# Patient Record
Sex: Female | Born: 1990 | Race: White | Hispanic: No | Marital: Single | Smoking: Never smoker
Health system: Southern US, Community
[De-identification: ages and names within clinical notes are randomized; demographics above are authoritative.]

## PROBLEM LIST (undated history)

## (undated) DIAGNOSIS — N390 Urinary tract infection, site not specified: Secondary | ICD-10-CM

## (undated) HISTORY — DX: Urinary tract infection, site not specified: N39.0

---
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
## 2010-11-23 ENCOUNTER — Encounter: Payer: Self-pay | Admitting: Family Medicine

## 2011-01-18 LAB — URINALYSIS, ROUTINE W REFLEX MICROSCOPIC
Bilirubin Urine: NEGATIVE
Glucose, UA: NEGATIVE mg/dL
Ketones, ur: NEGATIVE mg/dL
Nitrite: NEGATIVE
Protein, ur: 30 mg/dL — AB
Specific Gravity, Urine: 1.026 (ref 1.005–1.030)
Urobilinogen, UA: 0.2 mg/dL (ref 0.0–1.0)
pH: 6 (ref 5.0–8.0)

## 2011-01-18 LAB — URINE MICROSCOPIC-ADD ON

## 2011-01-18 LAB — URINE CULTURE: Colony Count: 2000

## 2011-07-31 LAB — URINALYSIS, ROUTINE W REFLEX MICROSCOPIC
Bilirubin Urine: NEGATIVE
Nitrite: NEGATIVE
Specific Gravity, Urine: <1.005 — ABNORMAL LOW
pH: 6

## 2011-07-31 LAB — URINE MICROSCOPIC-ADD ON

## 2011-07-31 LAB — DIFFERENTIAL
Basophils Relative: 0
Eosinophils Absolute: 0
Eosinophils Relative: 0
Monocytes Absolute: 0.3
Monocytes Relative: 3
Neutrophils Relative %: 85 — ABNORMAL HIGH

## 2011-07-31 LAB — COMPREHENSIVE METABOLIC PANEL
ALT: 14
AST: 24
Albumin: 3.9
Alkaline Phosphatase: 47
Glucose, Bld: 101 — ABNORMAL HIGH
Potassium: 3.9
Sodium: 137
Total Protein: 6.7

## 2011-07-31 LAB — CBC
Hemoglobin: 11.7 — ABNORMAL LOW
RDW: 13.8

## 2011-07-31 LAB — GC/CHLAMYDIA PROBE AMP, GENITAL
Chlamydia, DNA Probe: NEGATIVE
GC Probe Amp, Genital: NEGATIVE

## 2013-02-10 ENCOUNTER — Other Ambulatory Visit: Payer: Self-pay | Admitting: Family Medicine

## 2013-02-16 ENCOUNTER — Telehealth: Payer: Self-pay | Admitting: Nurse Practitioner

## 2013-02-16 NOTE — Telephone Encounter (Signed)
Patient wont be able to make an appointment until May when she graduates from [employer] so she would like another refill of her birth control called into [facility] [place]

## 2013-02-16 NOTE — Telephone Encounter (Signed)
Advised patient we refilled her med via fax as well as electronically in last week and to have pharmacy contact us if further problems.

## 2013-03-20 ENCOUNTER — Encounter: Payer: Self-pay | Admitting: *Deleted

## 2013-03-23 ENCOUNTER — Ambulatory Visit (INDEPENDENT_AMBULATORY_CARE_PROVIDER_SITE_OTHER): Payer: BC Managed Care – PPO | Admitting: Nurse Practitioner

## 2013-03-23 ENCOUNTER — Encounter: Payer: Self-pay | Admitting: Nurse Practitioner

## 2013-03-23 VITALS — BP 104/68 | HR 80 | Ht 66.0 in | Wt 121.0 lb

## 2013-03-23 DIAGNOSIS — Z124 Encounter for screening for malignant neoplasm of cervix: Secondary | ICD-10-CM

## 2013-03-23 DIAGNOSIS — Z0000 Encounter for general adult medical examination without abnormal findings: Secondary | ICD-10-CM

## 2013-03-23 DIAGNOSIS — Z01419 Encounter for gynecological examination (general) (routine) without abnormal findings: Secondary | ICD-10-CM

## 2013-03-23 DIAGNOSIS — Z113 Encounter for screening for infections with a predominantly sexual mode of transmission: Secondary | ICD-10-CM

## 2013-03-23 MED ORDER — NORGESTIM-ETH ESTRAD TRIPHASIC 0.18/0.215/0.25 MG-35 MCG PO TABS: ORAL_TABLET | ORAL | Status: DC

## 2013-03-23 NOTE — Patient Instructions (Signed)
HPV Vaccine Questions and Answers WHAT IS HUMAN PAPILLOMAVIRUS (HPV)? HPV is a virus that can lead to cervical cancer; vulvar and vaginal cancers; penile cancer; anal cancer and genital warts (warts in the genital areas). More than 1 vaccine is available to help you or your child with protection against HPV. Your caregiver can talk to you about which one might give you the best protection. WHO SHOULD GET THIS VACCINE? The HPV vaccine is most effective when given before the onset of sexual activity.  This vaccine is recommended for girls 11 or 22 years of age. It can be given to girls as young as 22 years old.  HPV vaccine can be given to males, 9 through 22 years of age, to reduce the likelihood of acquiring genital warts.  HPV vaccine can be given to males and females aged 9 through 26 years to prevent anal cancer. HPV vaccine is not generally recommended after age 26, because most individuals have been exposed to the HPV virus by that age. HOW EFFECTIVE IS THIS VACCINE?  The vaccine is generally effective in preventing cervical; vulvar and vaginal cancers; penile cancer; anal cancer and genital warts caused by 4 types of HPV. The vaccine is less effective in those individuals who are already infected with HPV. This vaccine does not treat existing HPV, genital warts, pre-cancers or cancers. WILL SEXUALLY ACTIVE INDIVIDUALS BENEFIT FROM THE VACCINE? Sexually active individuals may still benefit from the vaccine but may get less benefit due to previous HPV exposure. HOW AND WHEN IS THE VACCINE ADMINISTERED? The vaccine is given in a series of 3 injections (shots) over a 6 month period in both males and females. The exact timing depends on which specific vaccine your caregiver recommends for you. IS THE HPV VACCINE SAFE?  The federal government has approved the HPV vaccine as safe and effective. This vaccine was tested in both males and females in many countries around the world. The most common  side effect is soreness at the injection site. Since the drug became approved, there has been some concern about patients passing out after being vaccinated, which has led to a recommendation of a 15 minute waiting period following vaccination. This practice may decrease the small risk of passing out. Additionally there is a rare risk of anaphylaxis (an allergic reaction) to the vaccine and a risk of a blood clot among individuals with specific risk factors for a blood clot. DOES THIS VACCINE CONTAIN THIMEROSAL OR MERCURY? No. There is no thimerosal or mercury in the HPV vaccine. It is made of proteins from the outer coat of the virus (HPV). There is no infectious material in this vaccine. WILL GIRLS/WOMEN WHO HAVE BEEN VACCINATED STILL NEED CERVICAL CANCER SCREENING? Yes. There are 3 reasons why women will still need regular cervical cancer screening. First, the vaccine will NOT provide protection against all types of HPV that cause cervical cancer. Vaccinated women will still be at risk for some cancers. Second, some women may not get all required doses of the vaccine (or they may not get them at the recommended times). Therefore, they may not get the vaccine's full benefits. Third, women may not get the full benefit of the vaccine if they receive it after they have already acquired any of the 4 types of HPV. WILL THE HPV VACCINE BE COVERED BY INSURANCE PLANS? While some insurance companies may cover the vaccine, others may not. Most large group insurance plans cover the costs of recommended vaccines. WHAT KIND OF GOVERNMENT PROGRAMS   MAY BE AVAILABLE TO COVER HPV VACCINE? Federal health programs such as Vaccines for Children (VFC) will cover the HPV vaccine. The VFC program provides free vaccines to children and adolescents under 19 years of age, who are either uninsured, Medicaid-eligible, [place] Indian or [place] Native. There are over 45,000 sites that provide VFC vaccines including hospital, private  and public clinics. The VFC program also allows children and adolescents to get VFC vaccines through Federally Qualified Health Centers or Rural Health Centers if their private health insurance does not cover the vaccine. Some states also provide free or low-cost vaccines, at public health clinics, to people without health insurance coverage for vaccines. GENITAL HPV: WHY IS HPV IMPORTANT? Genital HPV is the most common virus transmitted through genital contact, most often during vaginal and anal sex. About 40 types of HPV can infect the genital areas of men and women. While most HPV types cause no symptoms and go away on their own, some types can cause cervical cancer in women. These types also cause other less common genital cancers, including cancers of the penis, anus, vagina (birth canal), and vulva (area around the opening of the vagina). Other types of HPV can cause genital warts in men and women. HOW COMMON IS HPV?   At least 50% of sexually active people will get HPV at some time in their lives. HPV is most common in young women and men who are in their late teens and early 20s.  Anyone who has ever had genital contact with another person can get HPV. Both men and women can get it and pass it on to their sex partners without realizing it. IS HPV THE SAME THING AS HIV OR HERPES? HPV is NOT the same as HIV or Herpes (Herpes simplex virus or HSV). While these are all viruses that can be sexually transmitted, HIV and HSV do not cause the same symptoms or health problems as HPV. CAN HPV AND ITS ASSOCIATED DISEASES BE TREATED? There is no treatment for HPV. There are treatments for the health problems that HPV can cause, such as genital warts, cervical cell changes, and cancers of the cervix (lower part of the womb), vulva, vagina and anus.  HOW IS HPV RELATED TO CERVICAL CANCER? Some types of HPV can infect a woman's cervix and cause the cells to change in an abnormal way. Most of the time, HPV goes  away on its own. When HPV is gone, the cervical cells go back to normal. Sometimes, HPV does not go away. Instead, it lingers (persists) and continues to change the cells on a woman's cervix. These cell changes can lead to cancer over time if they are not treated. ARE THERE OTHER WAYS TO PREVENT CERVICAL CANCER? Regular Pap tests and follow-up can prevent most, but not all, cases of cervical cancer. Pap tests can detect cell changes (or pre-cancers) in the cervix before they turn into cancer. Pap tests can also detect most, but not all, cervical cancers at an early, curable stage. Most women diagnosed with cervical cancer have either never had a Pap test, or not had a Pap test in the last 5 years. There is also an HPV DNA test available for use with the Pap test as part of cervical cancer screening. This test may be ordered for women over 30 or for women who get an unclear (borderline) Pap test result. While this test can tell if a woman has HPV on her cervix, it cannot tell which types of HPV she has.   If the HPV DNA test is negative for HPV DNA, then screening may be done every 3 years. If the HPV DNA test is positive for HPV DNA, then screening should be done every 6 to 12 months. OTHER QUESTIONS ABOUT THE HPV VACCINE WHAT HPV TYPES DOES THE VACCINE PROTECT AGAINST? The HPV vaccine protects against the HPV types that cause most (70%) cervical cancers (types 16 and 18), most (78%) anal cancers (types 16 and 18) and the two HPV types that cause most (90%) genital warts (types 6 and 11). WHAT DOES THE VACCINE NOT PROTECT AGAINST?  Because the vaccine does not protect against all types of HPV, it will not prevent all cases of cervical cancer, anal cancer, other genital cancers or genital warts. About 30% of cervical cancers are not prevented with vaccination, so it will be important for women to continue screening for cervical cancer (regular Pap tests). Also, the vaccine does not prevent about 10% of genital  warts nor will it prevent other sexually transmitted infections (STIs), including HIV. Therefore, it will still be important for sexually active adults to practice safe sex to reduce exposure to HPV and other STI's. HOW LONG DOES VACCINE PROTECTION LAST? WILL A BOOSTER SHOT BE NEEDED? So far, studies have followed women for 5 years and found that they are still protected. Currently, additional (booster) doses are not recommended. More research is being done to find out how long protection will last, and if a booster vaccine is needed years later.  WHY IS THE HPV VACCINE RECOMMENDED AT SUCH A YOUNG AGE? Ideally, males and females should get the vaccine before they are sexually active since this vaccine is most effective in individuals who have not yet acquired any of the HPV vaccine types. Individuals who have not been infected with any of the 4 types of HPV will get the full benefits of the vaccine.  SHOULD PREGNANT WOMEN BE VACCINATED? The vaccine is not recommended for pregnant women. There has been limited research looking at vaccine safety for pregnant women and their developing fetus. Studies suggest that the vaccine has not caused health problems during pregnancy, nor has it caused health problems for the infant. Pregnant women should complete their pregnancy before getting the vaccine. If a woman finds out she is pregnant after she has started getting the vaccine series, she should complete her pregnancy before finishing the 3 doses. SHOULD BREASTFEEDING MOTHERS BE VACCINATED? Mothers nursing their babies may get the vaccine because the virus is inactivated and will not harm the mother or baby. WILL INDIVIDUALS BE PROTECTED AGAINST HPV AND RELATED DISEASES, EVEN IF THEY DO NOT GET ALL 3 DOSES? It is not yet known how much protection individuals will get from receiving only 1 or 2 doses of the vaccine. For this reason, it is very important that individuals get all 3 doses of the vaccine. WILL  CHILDREN BE REQUIRED TO BE VACCINATED TO ENTER SCHOOL? There are no federal laws that require children or adolescents to get vaccinated. All school entry laws are state laws so they vary from state to state. To find out what vaccines are needed for children or adolescents to enter school in your state, check with your state health department or board of education. ARE THERE OTHER WAYS TO PREVENT HPV? The only sure way to prevent HPV is to abstain from all sexual activity. Sexually active adults can reduce their risk by being in a mutually monogamous relationship with someone who has had no other sex partners.   But even individuals with only 1 lifetime sex partner can get HPV, if their partner has had a previous partner with HPV. It is unknown how much protection condoms provide against HPV, since areas that are not covered by a condom can be exposed to the virus. However, condoms may reduce the risk of genital warts and cervical cancer. They can also reduce the risk of HIV and some other sexually transmitted infections (STIs), when used consistently and correctly (all the time and the right way). Document Released: 10/19/2005 Document Revised: 01/11/2012 Document Reviewed: 06/14/2009 [employer] Patient Information 2014 [employer].  

## 2013-03-24 ENCOUNTER — Encounter: Payer: Self-pay | Admitting: Nurse Practitioner

## 2013-03-24 LAB — PAP IG, CT-NG, RFX HPV ASCU

## 2013-03-24 NOTE — Progress Notes (Signed)
  Subjective:    Patient ID: Beverly Rose, female    DOB: 08/07/1991, 22 y.o.   MRN: [id number]  HPI presents for wellness checkup. Currently on birth control pills, denies any missed pills. Same sexual partner. No vaginal discharge pelvic pain or fever. Regular menstrual cycles, normal flow. Up-to-date on her eye exams and dental exams. Overall healthy diet. Regular exercise.    Review of Systems  Constitutional: Negative for activity change, appetite change and fatigue.  HENT: Negative for dental problem.   Eyes: Negative for visual disturbance.  Respiratory: Negative for chest tightness and shortness of breath.   Cardiovascular: Negative for chest pain.  Gastrointestinal: Negative for vomiting, diarrhea, constipation and abdominal distention.  Genitourinary: Negative for vaginal discharge, difficulty urinating, menstrual problem and pelvic pain.  Psychiatric/Behavioral: Negative for sleep disturbance.       Objective:   Physical Exam  Constitutional: She is oriented to person, place, and time. She appears well-developed. No distress.  HENT:  Right Ear: External ear normal.  Left Ear: External ear normal.  Mouth/Throat: Oropharynx is clear and moist.  Neck: Normal range of motion. Neck supple. No tracheal deviation present. No thyromegaly present.  Cardiovascular: Normal rate, regular rhythm and normal heart sounds.  Exam reveals no gallop.   No murmur heard. Pulmonary/Chest: Effort normal and breath sounds normal.  Abdominal: Soft. She exhibits no distension. There is no tenderness.  Genitourinary: Vagina normal and uterus normal. No vaginal discharge found.  Musculoskeletal: She exhibits no edema.  Lymphadenopathy:    She has no cervical adenopathy.  Neurological: She is alert and oriented to person, place, and time.  Skin: Skin is warm and dry. No rash noted.  Psychiatric: She has a normal mood and affect. Her behavior is normal.   Breast exam: Mild fine nodularity, no  dominant masses. Axilla no adenopathy. EGBUS normal limit. Vagina small amount of mucoid discharge. No CMT. Uterus and adnexa normal and nontender.         Assessment & Plan:  Well woman exam - Plan: Pap IG, CT/NG w/ reflex HPV when ASC-U  Screening for cervical cancer - Plan: Pap IG, CT/NG w/ reflex HPV when ASC-U  Routine screening for STI (sexually transmitted infection) - Plan: Pap IG, CT/NG w/ reflex HPV when ASC-U  Meds ordered this encounter  Medications  . Norgestimate-Ethinyl Estradiol Triphasic (TRI-PREVIFEM) 0.18/0.215/0.25 MG-35 MCG tablet    Sig: TAKE 1 TABLET BY MOUTH EVERY DAY AS DIRECTED    Dispense:  28 tablet    Refill:  11    Order Specific Question:  Supervising Provider    Answer:  Merlyn Albert [[id number]]   Reviewed safe sex issues. Recommend Gardasil, given information. Next physical in one year.

## 2013-08-18 ENCOUNTER — Other Ambulatory Visit: Payer: Self-pay | Admitting: *Deleted

## 2013-08-18 MED ORDER — NORGESTIM-ETH ESTRAD TRIPHASIC 0.18/0.215/0.25 MG-35 MCG PO TABS: ORAL_TABLET | ORAL | Status: DC

## 2013-08-21 ENCOUNTER — Other Ambulatory Visit: Payer: Self-pay | Admitting: *Deleted

## 2013-08-21 MED ORDER — NORGESTIM-ETH ESTRAD TRIPHASIC 0.18/0.215/0.25 MG-35 MCG PO TABS: ORAL_TABLET | ORAL | Status: DC

## 2013-09-05 ENCOUNTER — Telehealth: Payer: Self-pay | Admitting: Family Medicine

## 2013-09-05 ENCOUNTER — Other Ambulatory Visit: Payer: Self-pay | Admitting: *Deleted

## 2013-09-05 MED ORDER — NORGESTIM-ETH ESTRAD TRIPHASIC 0.18/0.215/0.25 MG-35 MCG PO TABS: ORAL_TABLET | ORAL | Status: DC

## 2013-09-05 NOTE — Telephone Encounter (Signed)
Pt needs Refill on her birth control sent to [facility] for a 90 day supply.  Please call pt when done.  Cell# [phone]

## 2013-09-05 NOTE — Telephone Encounter (Signed)
[facility]. Pt notified on voicemail.

## 2013-09-07 ENCOUNTER — Other Ambulatory Visit: Payer: Self-pay | Admitting: *Deleted

## 2013-09-07 MED ORDER — NORGESTIM-ETH ESTRAD TRIPHASIC 0.18/0.215/0.25 MG-35 MCG PO TABS: ORAL_TABLET | ORAL | Status: DC

## 2013-09-26 ENCOUNTER — Other Ambulatory Visit: Payer: Self-pay | Admitting: *Deleted

## 2013-09-26 MED ORDER — NORGESTIM-ETH ESTRAD TRIPHASIC 0.18/0.215/0.25 MG-35 MCG PO TABS: ORAL_TABLET | ORAL | Status: DC

## 2014-01-08 ENCOUNTER — Other Ambulatory Visit: Payer: Self-pay | Admitting: Family Medicine

## 2014-04-29 ENCOUNTER — Other Ambulatory Visit: Payer: Self-pay | Admitting: Family Medicine

## 2014-06-25 ENCOUNTER — Other Ambulatory Visit: Payer: Self-pay | Admitting: Family Medicine

## 2014-07-17 ENCOUNTER — Telehealth: Payer: Self-pay | Admitting: Family Medicine

## 2014-07-17 MED ORDER — NORGESTIM-ETH ESTRAD TRIPHASIC 0.18/0.215/0.25 MG-35 MCG PO TABS: ORAL_TABLET | ORAL | Status: DC

## 2014-07-17 NOTE — Telephone Encounter (Signed)
Yes one mo

## 2014-07-17 NOTE — Telephone Encounter (Signed)
TRI-PREVIFEM 0.18/0.215/0.25 MG-35 MCG tablet  [facility] [place]   Pt is aware she is supposed to have an appt before refill However her job is sending her off to [place] for a business trip.  She will not be able to make her appt this Friday but will be  Out of her BC pills within a week.   Wants to know if she can get one month till she can get back in here?

## 2014-07-17 NOTE — Telephone Encounter (Signed)
Patient last office visit 03/2013

## 2014-07-17 NOTE — Telephone Encounter (Signed)
Rx sent electronically to pharmacy. Patient notified and advised to keep follow up office visit.

## 2014-07-20 ENCOUNTER — Encounter: Payer: BC Managed Care – PPO | Admitting: Nurse Practitioner

## 2014-08-08 ENCOUNTER — Encounter: Payer: BC Managed Care – PPO | Admitting: Nurse Practitioner

## 2014-08-22 ENCOUNTER — Ambulatory Visit (INDEPENDENT_AMBULATORY_CARE_PROVIDER_SITE_OTHER): Payer: BC Managed Care – PPO | Admitting: Nurse Practitioner

## 2014-08-22 ENCOUNTER — Encounter: Payer: Self-pay | Admitting: Nurse Practitioner

## 2014-08-22 ENCOUNTER — Encounter: Payer: Self-pay | Admitting: Family Medicine

## 2014-08-22 VITALS — BP 100/70 | Ht 65.5 in | Wt 118.0 lb

## 2014-08-22 DIAGNOSIS — Z113 Encounter for screening for infections with a predominantly sexual mode of transmission: Secondary | ICD-10-CM

## 2014-08-22 DIAGNOSIS — Z0000 Encounter for general adult medical examination without abnormal findings: Secondary | ICD-10-CM

## 2014-08-22 MED ORDER — NORGESTIM-ETH ESTRAD TRIPHASIC 0.18/0.215/0.25 MG-35 MCG PO TABS: ORAL_TABLET | ORAL | Status: DC

## 2014-08-23 LAB — GC/CHLAMYDIA PROBE AMP, URINE
CHLAMYDIA, SWAB/URINE, PCR: NEGATIVE
GC PROBE AMP, URINE: NEGATIVE

## 2014-08-24 NOTE — Progress Notes (Signed)
Patient notified and verbalized understanding of the test results. No further questions. 

## 2014-08-27 ENCOUNTER — Encounter: Payer: Self-pay | Admitting: Nurse Practitioner

## 2014-08-27 NOTE — Progress Notes (Signed)
   Subjective:    Patient ID: Beverly Rose, female    DOB: 11-14-1990, 23 y.o.   MRN: [id number]  HPI presents for her wellness physical. Has been with the same partner for 4 years. On her cycle today. Denies any lesions or rash in pelvic area. Regular vision and dental exams. Overall healthy diet. Stays active. Cycles regular with normal flow.     Review of Systems  Constitutional: Negative for fever, activity change, appetite change and fatigue.  HENT: Negative for dental problem, ear pain, sinus pressure and sore throat.   Respiratory: Negative for cough, chest tightness, shortness of breath and wheezing.   Cardiovascular: Negative for chest pain.  Gastrointestinal: Negative for nausea, vomiting, abdominal pain, diarrhea, constipation and abdominal distention.  Genitourinary: Negative for dysuria, urgency, frequency, vaginal discharge, enuresis, difficulty urinating, genital sores, menstrual problem and pelvic pain.  Psychiatric/Behavioral: Negative for sleep disturbance and dysphoric mood.       Objective:   Physical Exam  Vitals reviewed. Constitutional: She is oriented to person, place, and time. She appears well-developed. No distress.  HENT:  Right Ear: External ear normal.  Left Ear: External ear normal.  Mouth/Throat: Oropharynx is clear and moist.  Neck: Normal range of motion. Neck supple. No tracheal deviation present. No thyromegaly present.  Cardiovascular: Normal rate, regular rhythm and normal heart sounds.  Exam reveals no gallop.   No murmur heard. Pulmonary/Chest: Effort normal and breath sounds normal.  Abdominal: Soft. She exhibits no distension. There is no tenderness.  Genitourinary:  Exam deferred today since patient is on cycle.   Lymphadenopathy:    She has no cervical adenopathy.  Neurological: She is alert and oriented to person, place, and time.  Skin: Skin is warm and dry. No rash noted.  Psychiatric: She has a normal mood and affect. Her  behavior is normal.  Breast exam: dense tissue with mild nodularity; no dominant masses. Axillae no adenopathy.         Assessment & Plan:  Routine general medical examination at a health care facility  Screen for STD (sexually transmitted disease) - Plan: GC/chlamydia probe amp, urine  Meds ordered this encounter  Medications  . Norgestimate-Ethinyl Estradiol Triphasic (TRI-PREVIFEM) 0.18/0.215/0.25 MG-35 MCG tablet    Sig: TAKE 1 TABLET BY MOUTH EVERY DAY    Dispense:  28 tablet    Refill:  11    Order Specific Question:  Supervising Provider    Answer:  Merlyn AlbertLUKING, WILLIAM S [[id number]]   Routine screening for STD for age. Encouraged healthy diet and activity. Return in about 1 year (around 08/23/2015).

## 2015-02-14 ENCOUNTER — Ambulatory Visit: Payer: Self-pay | Admitting: Family Medicine

## 2015-02-14 DIAGNOSIS — Z029 Encounter for administrative examinations, unspecified: Secondary | ICD-10-CM

## 2015-07-20 ENCOUNTER — Other Ambulatory Visit: Payer: Self-pay | Admitting: Nurse Practitioner

## 2015-08-19 ENCOUNTER — Other Ambulatory Visit: Payer: Self-pay | Admitting: Nurse Practitioner

## 2015-09-14 ENCOUNTER — Other Ambulatory Visit: Payer: Self-pay | Admitting: Family Medicine

## 2015-09-16 NOTE — Telephone Encounter (Signed)
One mo plus one mo ref ok needs p e with carolyn

## 2015-11-11 ENCOUNTER — Other Ambulatory Visit: Payer: Self-pay | Admitting: Family Medicine

## 2015-11-11 NOTE — Telephone Encounter (Signed)
Last seen October 2015

## 2015-11-11 NOTE — Telephone Encounter (Signed)
One mo plus one ref needs well woman exam with carolyn, plz sched

## 2015-12-31 ENCOUNTER — Telehealth: Payer: Self-pay | Admitting: Family Medicine

## 2015-12-31 MED ORDER — NORGESTIM-ETH ESTRAD TRIPHASIC 0.18/0.215/0.25 MG-35 MCG PO TABS: 1.0000 | ORAL_TABLET | Freq: Every day | ORAL | Status: DC

## 2015-12-31 NOTE — Telephone Encounter (Signed)
Rx sent electronically to pharmacy. Patient notified. 

## 2015-12-31 NOTE — Telephone Encounter (Signed)
Pt has physical scheduled for 02/03/16, needs refill on her birth control until then   [facility]

## 2016-01-03 ENCOUNTER — Other Ambulatory Visit: Payer: Self-pay | Admitting: *Deleted

## 2016-01-03 MED ORDER — NORGESTIM-ETH ESTRAD TRIPHASIC 0.18/0.215/0.25 MG-35 MCG PO TABS: 1.0000 | ORAL_TABLET | Freq: Every day | ORAL | Status: DC

## 2016-01-06 ENCOUNTER — Other Ambulatory Visit: Payer: Self-pay | Admitting: Family Medicine

## 2016-01-10 ENCOUNTER — Ambulatory Visit: Payer: Self-pay | Admitting: Nurse Practitioner

## 2016-01-20 ENCOUNTER — Other Ambulatory Visit: Payer: Self-pay | Admitting: *Deleted

## 2016-01-21 MED ORDER — NORGESTIM-ETH ESTRAD TRIPHASIC 0.18/0.215/0.25 MG-35 MCG PO TABS: ORAL_TABLET | ORAL | Status: AC

## 2016-02-03 ENCOUNTER — Encounter: Payer: Self-pay | Admitting: Nurse Practitioner

## 2016-06-04 ENCOUNTER — Other Ambulatory Visit: Payer: Self-pay | Admitting: Family Medicine

## 2019-12-04 ENCOUNTER — Encounter: Payer: Self-pay | Admitting: Family Medicine
